# Patient Record
Sex: Female | Born: 1960 | Race: White | Hispanic: No | Marital: Married | Smoking: Never smoker
Health system: Southern US, Community
[De-identification: ages and names within clinical notes are randomized; demographics above are authoritative.]

## PROBLEM LIST (undated history)

## (undated) DIAGNOSIS — F329 Major depressive disorder, single episode, unspecified: Secondary | ICD-10-CM

## (undated) DIAGNOSIS — F32A Depression, unspecified: Secondary | ICD-10-CM

## (undated) DIAGNOSIS — F419 Anxiety disorder, unspecified: Secondary | ICD-10-CM

## (undated) DIAGNOSIS — M1990 Unspecified osteoarthritis, unspecified site: Secondary | ICD-10-CM

## (undated) DIAGNOSIS — I10 Essential (primary) hypertension: Secondary | ICD-10-CM

## (undated) HISTORY — PX: COCCYX REMOVAL: SHX600

## (undated) HISTORY — PX: BACK SURGERY: SHX140

---

## 1998-11-25 ENCOUNTER — Other Ambulatory Visit: Admission: RE | Admit: 1998-11-25 | Discharge: 1998-11-25 | Payer: Self-pay | Admitting: Obstetrics & Gynecology

## 2000-05-25 ENCOUNTER — Other Ambulatory Visit: Admission: RE | Admit: 2000-05-25 | Discharge: 2000-05-25 | Payer: Self-pay | Admitting: Obstetrics & Gynecology

## 2001-06-24 ENCOUNTER — Other Ambulatory Visit: Admission: RE | Admit: 2001-06-24 | Discharge: 2001-06-24 | Payer: Self-pay | Admitting: Obstetrics & Gynecology

## 2001-09-11 ENCOUNTER — Emergency Department (HOSPITAL_COMMUNITY): Admission: EM | Admit: 2001-09-11 | Discharge: 2001-09-11 | Payer: Self-pay

## 2004-03-17 ENCOUNTER — Encounter: Admission: RE | Admit: 2004-03-17 | Discharge: 2004-03-17 | Payer: Self-pay | Admitting: Obstetrics & Gynecology

## 2004-05-12 ENCOUNTER — Other Ambulatory Visit: Admission: RE | Admit: 2004-05-12 | Discharge: 2004-05-12 | Payer: Self-pay | Admitting: Obstetrics & Gynecology

## 2005-09-11 ENCOUNTER — Other Ambulatory Visit: Admission: RE | Admit: 2005-09-11 | Discharge: 2005-09-11 | Payer: Self-pay | Admitting: Obstetrics & Gynecology

## 2007-05-21 ENCOUNTER — Encounter: Admission: RE | Admit: 2007-05-21 | Discharge: 2007-05-21 | Payer: Self-pay | Admitting: Family Medicine

## 2009-09-29 ENCOUNTER — Emergency Department (HOSPITAL_COMMUNITY): Admission: EM | Admit: 2009-09-29 | Discharge: 2009-09-29 | Payer: Self-pay | Admitting: Emergency Medicine

## 2010-11-09 LAB — POCT I-STAT, CHEM 8
BUN: 11 mg/dL (ref 6–23)
Calcium, Ion: 1.16 mmol/L (ref 1.12–1.32)
Chloride: 103 mEq/L (ref 96–112)
Creatinine, Ser: 0.9 mg/dL (ref 0.4–1.2)
Glucose, Bld: 101 mg/dL — ABNORMAL HIGH (ref 70–99)
HCT: 49 % — ABNORMAL HIGH (ref 36.0–46.0)
Hemoglobin: 16.7 g/dL — ABNORMAL HIGH (ref 12.0–15.0)
Potassium: 4.1 mEq/L (ref 3.5–5.1)
Sodium: 136 mEq/L (ref 135–145)
TCO2: 25 mmol/L (ref 0–100)

## 2014-10-08 ENCOUNTER — Other Ambulatory Visit: Payer: Self-pay | Admitting: Obstetrics & Gynecology

## 2014-10-08 DIAGNOSIS — R928 Other abnormal and inconclusive findings on diagnostic imaging of breast: Secondary | ICD-10-CM

## 2014-10-15 ENCOUNTER — Ambulatory Visit
Admission: RE | Admit: 2014-10-15 | Discharge: 2014-10-15 | Disposition: A | Discharge status: Home / Self Care | Payer: PRIVATE HEALTH INSURANCE | Source: Ambulatory Visit | Attending: Obstetrics & Gynecology | Admitting: Obstetrics & Gynecology

## 2014-10-15 DIAGNOSIS — R928 Other abnormal and inconclusive findings on diagnostic imaging of breast: Secondary | ICD-10-CM

## 2015-01-20 ENCOUNTER — Emergency Department (INDEPENDENT_AMBULATORY_CARE_PROVIDER_SITE_OTHER)
Admission: EM | Admit: 2015-01-20 | Discharge: 2015-01-20 | Disposition: A | Discharge status: Home / Self Care | Payer: PRIVATE HEALTH INSURANCE | Source: Home / Self Care | Attending: Emergency Medicine | Admitting: Emergency Medicine

## 2015-01-20 ENCOUNTER — Encounter: Payer: Self-pay | Admitting: *Deleted

## 2015-01-20 DIAGNOSIS — L739 Follicular disorder, unspecified: Secondary | ICD-10-CM | POA: Diagnosis not present

## 2015-01-20 HISTORY — DX: Essential (primary) hypertension: I10

## 2015-01-20 HISTORY — DX: Anxiety disorder, unspecified: F41.9

## 2015-01-20 HISTORY — DX: Major depressive disorder, single episode, unspecified: F32.9

## 2015-01-20 HISTORY — DX: Depression, unspecified: F32.A

## 2015-01-20 MED ORDER — CLINDAMYCIN PHOSPHATE 1 % EX SOLN
Freq: Two times a day (BID) | CUTANEOUS | Status: DC
Start: 2015-01-20 — End: 2015-09-01

## 2015-01-20 MED ORDER — DOXYCYCLINE HYCLATE 100 MG PO CAPS: 100.0000 mg | ORAL_CAPSULE | Freq: Two times a day (BID) | ORAL | Status: DC

## 2015-01-20 NOTE — ED Notes (Signed)
Pt c/o axillary rash since February of this year. No new soaps or Deoderant used. Her PCP advisied her to stop shaving which did not clear up the rash, she was later treated for a sinus infection and told it would cover treatment for the rash, prednisone and Clindamycin. Rash improved but never resolved. She c/o pain extending from her left axilla down her left side. Also has aches on neck, chest and left groin.

## 2015-01-20 NOTE — ED Provider Notes (Signed)
CSN: [id number]     Arrival date & time 01/20/15  1011 History   First MD Initiated Contact with Patient 01/20/15 1042     Chief Complaint  Patient presents with  . Rash   (Consider location/radiation/quality/duration/timing/severity/associated sxs/prior Treatment) HPI Inflamed rash both axillary areas where she shaves.. Intermittently for 2-3 months but recently flared up. Saw PCP couple months ago who prescribed oral clindamycin and prednisone and that helped somewhat but not completely. Had normal mammogram about a month ago she states. Has had normal breast exams at PCP. No fever or chills or nausea or vomiting. Denies using any new soap or detergent or deodorant or any new allergens  Past Medical History  Diagnosis Date  . Hypertension   . Anxiety and depression    Past Surgical History  Procedure Laterality Date  . Cesarean section    . Back surgery    . Coccyx removal     Family History  Problem Relation Age of Onset  . Heart attack Mother   . Heart attack Father    History  Substance Use Topics  . Smoking status: Never Smoker   . Smokeless tobacco: Never Used  . Alcohol Use: Yes   OB History    No data available     Review of Systems Remainder of Review of Systems negative for acute change except as noted in the HPI.  Allergies  Demerol and Percocet  Home Medications   Prior to Admission medications   Medication Sig Start Date End Date Taking? Authorizing Provider  buPROPion (WELLBUTRIN XL) 300 MG 24 hr tablet Take 300 mg by mouth daily.   Yes Historical Provider, MD  estradiol (ESTRACE) 2 MG tablet Take 2 mg by mouth daily.   Yes Historical Provider, MD  metoprolol succinate (TOPROL-XL) 50 MG 24 hr tablet Take 50 mg by mouth daily. Take with or immediately following a meal.   Yes Historical Provider, MD  Multiple Minerals-Vitamins (CALCIUM & VIT D3 BONE HEALTH PO) Take by mouth.   Yes Historical Provider, MD  omeprazole (PRILOSEC) 20 MG capsule Take 20 mg  by mouth daily.   Yes Historical Provider, MD  clindamycin (CLEOCIN-T) 1 % external solution Apply topically 2 (two) times daily. 01/20/15   Lajean Manesavid Massey, MD  doxycycline (VIBRAMYCIN) 100 MG capsule Take 1 capsule (100 mg total) by mouth 2 (two) times daily. For 10-14 days 01/20/15   Lajean Manesavid Massey, MD   BP 159/97 mmHg  Pulse 81  Temp(Src) 98.3 F (36.8 C) (Oral)  Resp 16  Ht 5\' 5"  (1.651 m)  Wt 212 lb (96.163 kg)  BMI 35.28 kg/m2  SpO2 97% Physical Exam  Constitutional: She is oriented to person, place, and time. She appears well-developed and well-nourished. No distress.  HENT:  Head: Normocephalic and atraumatic.  Eyes: Conjunctivae and EOM are normal. Pupils are equal, round, and reactive to light. No scleral icterus.  Neck: Normal range of motion.  Cardiovascular: Normal rate.   Pulmonary/Chest: Effort normal.  Abdominal: She exhibits no distension.  Musculoskeletal: Normal range of motion.  Neurological: She is alert and oriented to person, place, and time.  Skin: Skin is warm. Rash noted.  Folliculitis type rash both axilla  Psychiatric: She has a normal mood and affect.  Nursing note and vitals reviewed.   ED Course  Procedures (including critical care time) Labs Review Labs Reviewed - No data to display  Imaging Review No results found.   MDM   1. Folliculitis    Treatment options  discussed, as well as risks, benefits, alternatives. Patient voiced understanding and agreement with the following plans: Discharge Medication List as of 01/20/2015 11:14 AM    START taking these medications   Details  clindamycin (CLEOCIN-T) 1 % external solution Apply topically 2 (two) times daily., Starting 01/20/2015, Until Discontinued, Normal    doxycycline (VIBRAMYCIN) 100 MG capsule Take 1 capsule (100 mg total) by mouth 2 (two) times daily. For 10-14 days, Starting 01/20/2015, Until Discontinued, Normal       Other advice given. Follow-up with dermatologist in 5-7 days if not  improving, or sooner if symptoms become worse. Precautions discussed. Red flags discussed. Questions invited and answered. Patient voiced understanding and agreement.     Lajean Manes, MD 01/20/15 [id number]

## 2015-09-01 ENCOUNTER — Emergency Department (HOSPITAL_COMMUNITY): Payer: PRIVATE HEALTH INSURANCE

## 2015-09-01 ENCOUNTER — Encounter (HOSPITAL_COMMUNITY): Payer: Self-pay | Admitting: Emergency Medicine

## 2015-09-01 ENCOUNTER — Emergency Department (HOSPITAL_COMMUNITY)
Admission: EM | Admit: 2015-09-01 | Discharge: 2015-09-01 | Disposition: A | Discharge status: Home / Self Care | Payer: PRIVATE HEALTH INSURANCE | Attending: Emergency Medicine | Admitting: Emergency Medicine

## 2015-09-01 DIAGNOSIS — R002 Palpitations: Secondary | ICD-10-CM | POA: Insufficient documentation

## 2015-09-01 DIAGNOSIS — F418 Other specified anxiety disorders: Secondary | ICD-10-CM | POA: Insufficient documentation

## 2015-09-01 DIAGNOSIS — Z79899 Other long term (current) drug therapy: Secondary | ICD-10-CM | POA: Insufficient documentation

## 2015-09-01 DIAGNOSIS — Z792 Long term (current) use of antibiotics: Secondary | ICD-10-CM | POA: Insufficient documentation

## 2015-09-01 DIAGNOSIS — I10 Essential (primary) hypertension: Secondary | ICD-10-CM | POA: Insufficient documentation

## 2015-09-01 DIAGNOSIS — R1013 Epigastric pain: Secondary | ICD-10-CM | POA: Diagnosis not present

## 2015-09-01 DIAGNOSIS — E063 Autoimmune thyroiditis: Secondary | ICD-10-CM | POA: Diagnosis not present

## 2015-09-01 LAB — BASIC METABOLIC PANEL
Anion gap: 10 (ref 5–15)
BUN: 11 mg/dL (ref 6–20)
CHLORIDE: 102 mmol/L (ref 101–111)
CO2: 24 mmol/L (ref 22–32)
Calcium: 9.3 mg/dL (ref 8.9–10.3)
Creatinine, Ser: 0.78 mg/dL (ref 0.44–1.00)
GFR calc Af Amer: >60 mL/min (ref >60)
GFR calc non Af Amer: >60 mL/min (ref >60)
GLUCOSE: 109 mg/dL — AB (ref 65–99)
POTASSIUM: 4.5 mmol/L (ref 3.5–5.1)
SODIUM: 136 mmol/L (ref 135–145)

## 2015-09-01 LAB — CBC
HCT: 40.6 % (ref 36.0–46.0)
HEMOGLOBIN: 13.4 g/dL (ref 12.0–15.0)
MCH: 32 pg (ref 26.0–34.0)
MCHC: 33 g/dL (ref 30.0–36.0)
MCV: 96.9 fL (ref 78.0–100.0)
Platelets: 286 10*3/uL (ref 150–400)
RBC: 4.19 MIL/uL (ref 3.87–5.11)
RDW: 13.2 % (ref 11.5–15.5)
WBC: 4 10*3/uL (ref 4.0–10.5)

## 2015-09-01 LAB — TROPONIN I

## 2015-09-01 NOTE — ED Notes (Signed)
Per [facility] patient complains of frequent heart palpitations over the last several weeks and significant heartburn that started today around 1030 am.   Patient also states she started a new thyroid medication today.

## 2015-09-01 NOTE — Discharge Instructions (Signed)
If you were given medicines take as directed.  If you are on coumadin or contraceptives realize their levels and effectiveness is altered by many different medicines.  If you have any reaction (rash, tongues swelling, other) to the medicines stop taking and see a physician.    If your blood pressure was elevated in the ER make sure you follow up for management with a primary doctor or return for chest pain, shortness of breath or stroke symptoms.  Please follow up as directed and return to the ER or see a physician for new or worsening symptoms.  Thank you. Filed Vitals:   09/01/15 1204  BP: 142/79  Pulse: 71  Temp: 98 F (36.7 C)  TempSrc: Oral  Resp: 20  Height: 5\' 5"  (1.651 m)  Weight: 200 lb (90.719 kg)  SpO2: 99%

## 2015-09-01 NOTE — ED Provider Notes (Signed)
CSN: [id number]     Arrival date & time 09/01/15  1154 History   First MD Initiated Contact with Patient 09/01/15 1205     Chief Complaint  Patient presents with  . Palpitations     (Consider location/radiation/quality/duration/timing/severity/associated sxs/prior Treatment) HPI Comments: 55 year old female with history of Hashimoto's thyroiditis, anxiety presents with palpitations. Patient normally gets intermittent palpitations however they're more persistent and stronger today. Patient has had intermittent burning reflux sensation similar to multiple previous episodes. Currently no chest pain, palpitations resolved. Patient started on a new thyroid medicine today before the palpitations started. No syncope no recent surgeries no blood clot history no cardiac history. Patient has close outpatient follow-up.  Patient is a 55 y.o. female presenting with palpitations. The history is provided by the patient.  Palpitations Associated symptoms: no back pain, no chest pain, no shortness of breath and no vomiting     Past Medical History  Diagnosis Date  . Hypertension   . Anxiety and depression    Past Surgical History  Procedure Laterality Date  . Cesarean section    . Back surgery    . Coccyx removal     Family History  Problem Relation Age of Onset  . Heart attack Mother   . Heart attack Father    Social History  Substance Use Topics  . Smoking status: Never Smoker   . Smokeless tobacco: Never Used  . Alcohol Use: Yes   OB History    No data available     Review of Systems  Constitutional: Negative for fever and chills.  HENT: Negative for congestion.   Eyes: Negative for visual disturbance.  Respiratory: Negative for shortness of breath.   Cardiovascular: Positive for palpitations. Negative for chest pain.  Gastrointestinal: Negative for vomiting and abdominal pain.  Genitourinary: Negative for dysuria and flank pain.  Musculoskeletal: Negative for back pain, neck  pain and neck stiffness.  Skin: Negative for rash.  Neurological: Negative for light-headedness and headaches.      Allergies  Demerol and Percocet  Home Medications   Prior to Admission medications   Medication Sig Start Date End Date Taking? Authorizing Provider  ALPRAZolam Prudy Feeler) 0.5 MG tablet Take 0.25 mg by mouth 2 (two) times daily as needed for anxiety.  06/10/15  Yes Historical Provider, MD  cycloSPORINE (RESTASIS) 0.05 % ophthalmic emulsion Place 1 drop into both eyes 2 (two) times daily.  07/14/14  Yes Historical Provider, MD  EPINEPHrine (EPIPEN 2-PAK) 0.3 mg/0.3 mL IJ SOAJ injection Inject 0.3 mg into the muscle once.  01/29/14  Yes Historical Provider, MD  Evening Primrose Oil 500 MG CAPS Take by mouth.   Yes Historical Provider, MD  metoprolol succinate (TOPROL-XL) 50 MG 24 hr tablet Take 50 mg by mouth daily. Take with or immediately following a meal.   Yes Historical Provider, MD  Multiple Vitamins-Minerals (PRESERVISION AREDS 2) CAPS Take by mouth.   Yes Historical Provider, MD  NATURE-THROID 65 MG tablet Take 65 mg by mouth daily.  08/31/15  Yes Historical Provider, MD  progesterone (PROMETRIUM) 100 MG capsule Take 1 capsule by mouth daily.  10/07/14  Yes Historical Provider, MD  vitamin E 1000 UNIT capsule Take 1,000 Units by mouth daily.   Yes Historical Provider, MD  buPROPion (WELLBUTRIN XL) 300 MG 24 hr tablet Take 300 mg by mouth daily.    Historical Provider, MD  calcium citrate (CALCITRATE - DOSED IN MG ELEMENTAL CALCIUM) 950 MG tablet Take 200 mg of elemental calcium by mouth  daily.     Historical Provider, MD  Cholecalciferol (VITAMIN D) 2000 units tablet Take 2,000 Units by mouth daily.     Historical Provider, MD  clindamycin (CLEOCIN-T) 1 % external solution Apply topically 2 (two) times daily. 01/20/15   Lajean Manesavid Massey, MD  doxycycline (VIBRAMYCIN) 100 MG capsule Take 1 capsule (100 mg total) by mouth 2 (two) times daily. For 10-14 days 01/20/15   Lajean Manesavid Massey, MD   estradiol (ESTRACE) 2 MG tablet Take 2 mg by mouth daily.    Historical Provider, MD  Multiple Minerals-Vitamins (CALCIUM & VIT D3 BONE HEALTH PO) Take by mouth.    Historical Provider, MD  omeprazole (PRILOSEC) 20 MG capsule Take 20 mg by mouth daily.    Historical Provider, MD   BP 142/79 mmHg  Pulse 71  Temp(Src) 98 F (36.7 C) (Oral)  Resp 20  Ht 5\' 5"  (1.651 m)  Wt 200 lb (90.719 kg)  BMI 33.28 kg/m2  SpO2 99% Physical Exam  Constitutional: She is oriented to person, place, and time. She appears well-developed and well-nourished.  HENT:  Head: Normocephalic and atraumatic.  Eyes: Conjunctivae are normal. Right eye exhibits no discharge. Left eye exhibits no discharge.  Neck: Normal range of motion. Neck supple. No tracheal deviation present.  Cardiovascular: Normal rate and regular rhythm.   Pulmonary/Chest: Effort normal and breath sounds normal.  Abdominal: Soft. She exhibits no distension. There is no tenderness. There is no guarding.  Musculoskeletal: She exhibits no edema.  Neurological: She is alert and oriented to person, place, and time.  Skin: Skin is warm. No rash noted.  Psychiatric: She has a normal mood and affect.  Nursing note and vitals reviewed.   ED Course  Procedures (including critical care time) Labs Review Labs Reviewed  BASIC METABOLIC PANEL - Abnormal; Notable for the following:    Glucose, Bld 109 (*)    All other components within normal limits  CBC  TROPONIN I    Imaging Review Dg Chest 2 View  09/01/2015  CLINICAL DATA:  Palpitations and heartburn. EXAM: CHEST  2 VIEW COMPARISON:  None. FINDINGS: The heart size and mediastinal contours are within normal limits. Both lungs are clear. The visualized skeletal structures are unremarkable. IMPRESSION: No active cardiopulmonary disease. Electronically Signed   By: Elige KoHetal  Patel   On: 09/01/2015 12:50   I have personally reviewed and evaluated these images and lab results as part of my medical  decision-making.   EKG Interpretation   Date/Time:  Wednesday September 01 2015 11:58:56 EST Ventricular Rate:  75 PR Interval:  146 QRS Duration: 90 QT Interval:  365 QTC Calculation: 408 R Axis:   149 Text Interpretation:  Right and left arm electrode reversal,  interpretation assumes no reversal Sinus rhythm Right axis deviation  Abnormal T, consider ischemia, lateral leads Confirmed by Samary Shatz  MD,  Yvaine Jankowiak ([id number]) on 09/01/2015 12:57:34 PM      MDM   Final diagnoses:  Palpitations  Epigastric burning sensation   Well-appearing patient presents with worsening palpitations. No chest pain or shortness of breath. Patient has outpatient follow-up. Discussed having a new thyroid medicine and following up with prescriber. Patient stable for outpatient follow.  Results and differential diagnosis were discussed with the patient/parent/guardian. Xrays were independently reviewed by myself.  Close follow up outpatient was discussed, comfortable with the plan.   Medications - No data to display  Filed Vitals:   09/01/15 1204  BP: 142/79  Pulse: 71  Temp: 98 F (36.7 C)  TempSrc: Oral  Resp: 20  Height: 5\' 5"  (1.651 m)  Weight: 200 lb (90.719 kg)  SpO2: 99%    Final diagnoses:  Palpitations  Epigastric burning sensation       Blane Ohara, MD 09/01/15 1331

## 2015-09-01 NOTE — ED Notes (Signed)
Patient states she was started on "Natural Thyroid" today.

## 2016-05-17 IMAGING — CR DG CHEST 2V
2 series · 2 of 2 positions shown · non-contrast
Comparison: None.

CLINICAL DATA: Palpitations and heartburn.

EXAM:
CHEST  2 VIEW

[chest pa]
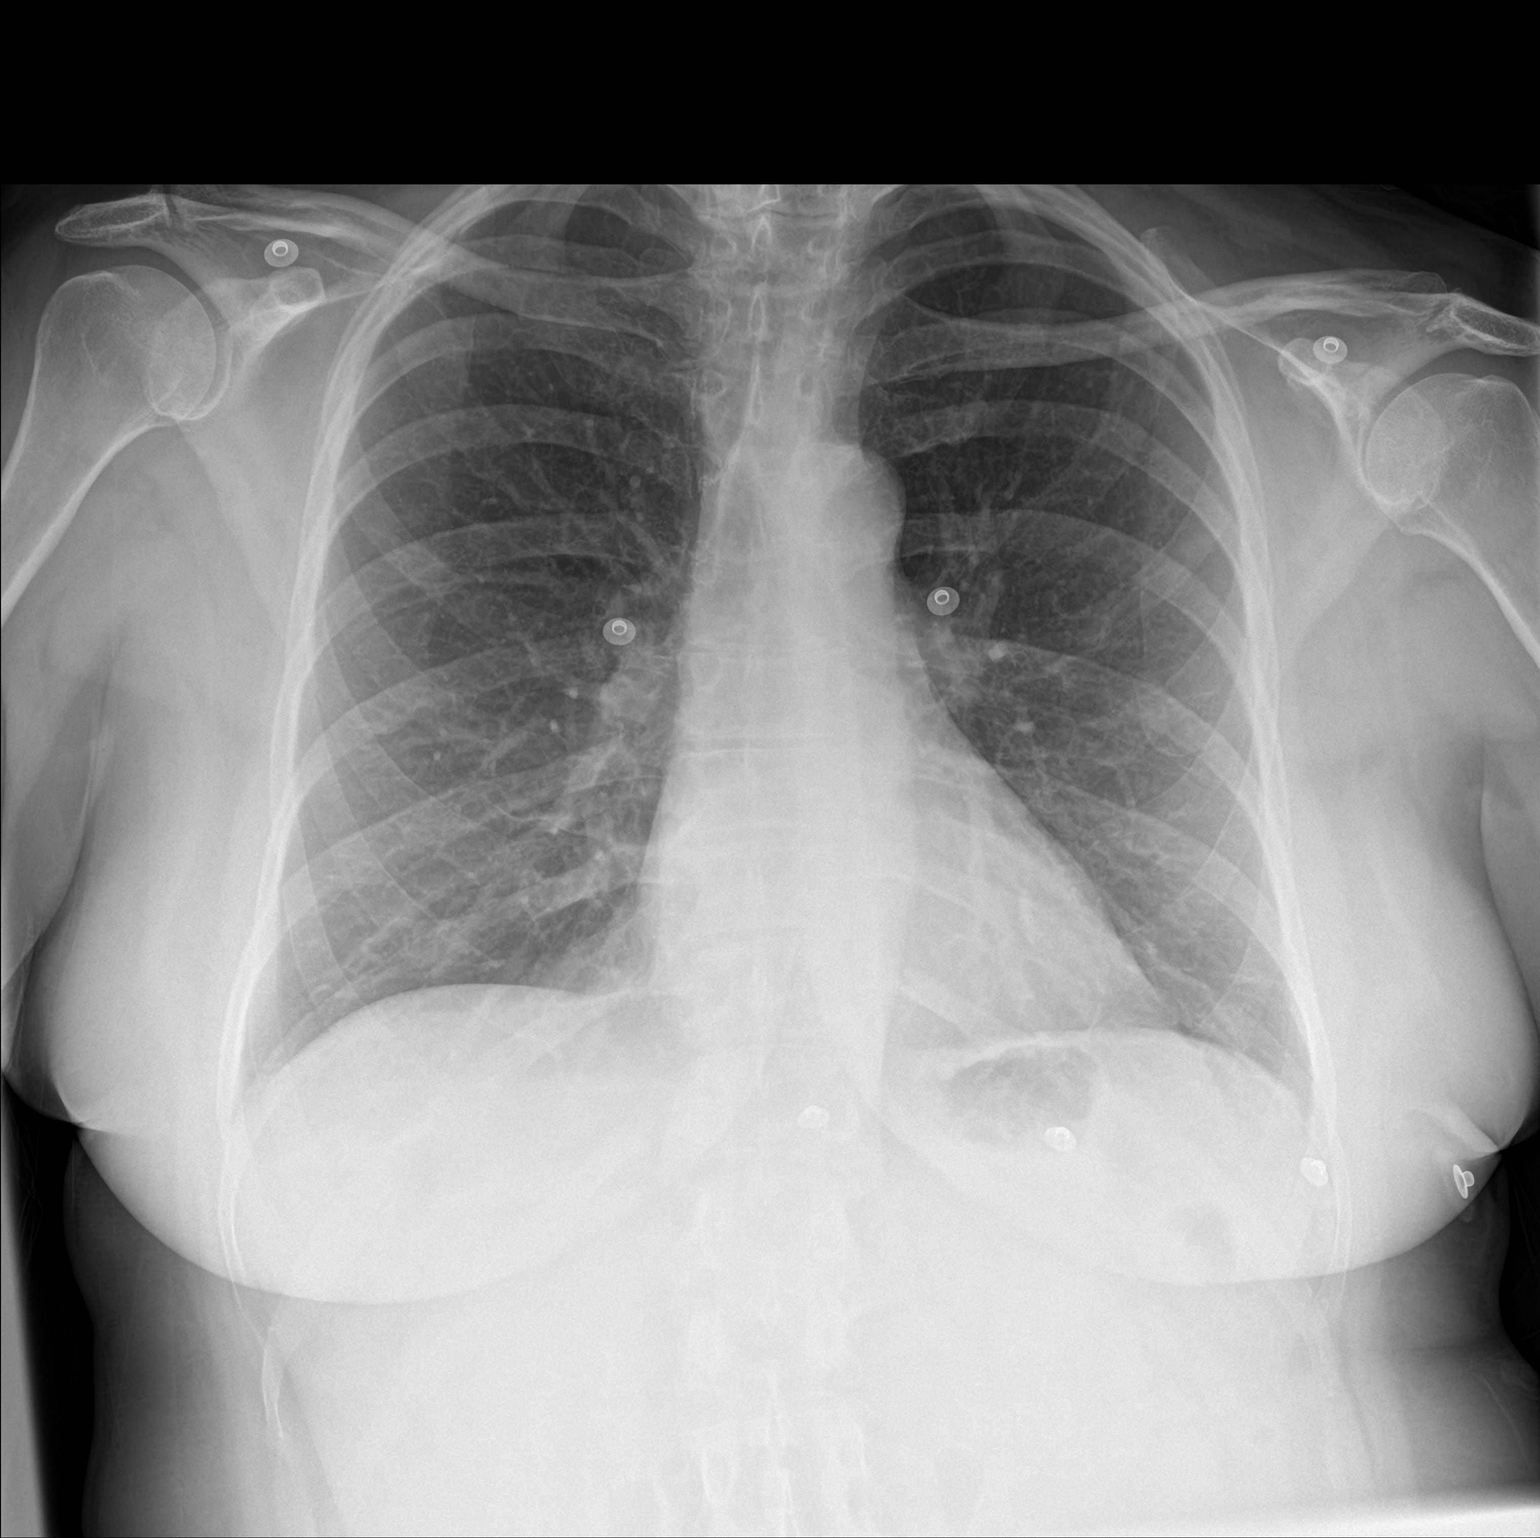

[chest lat]
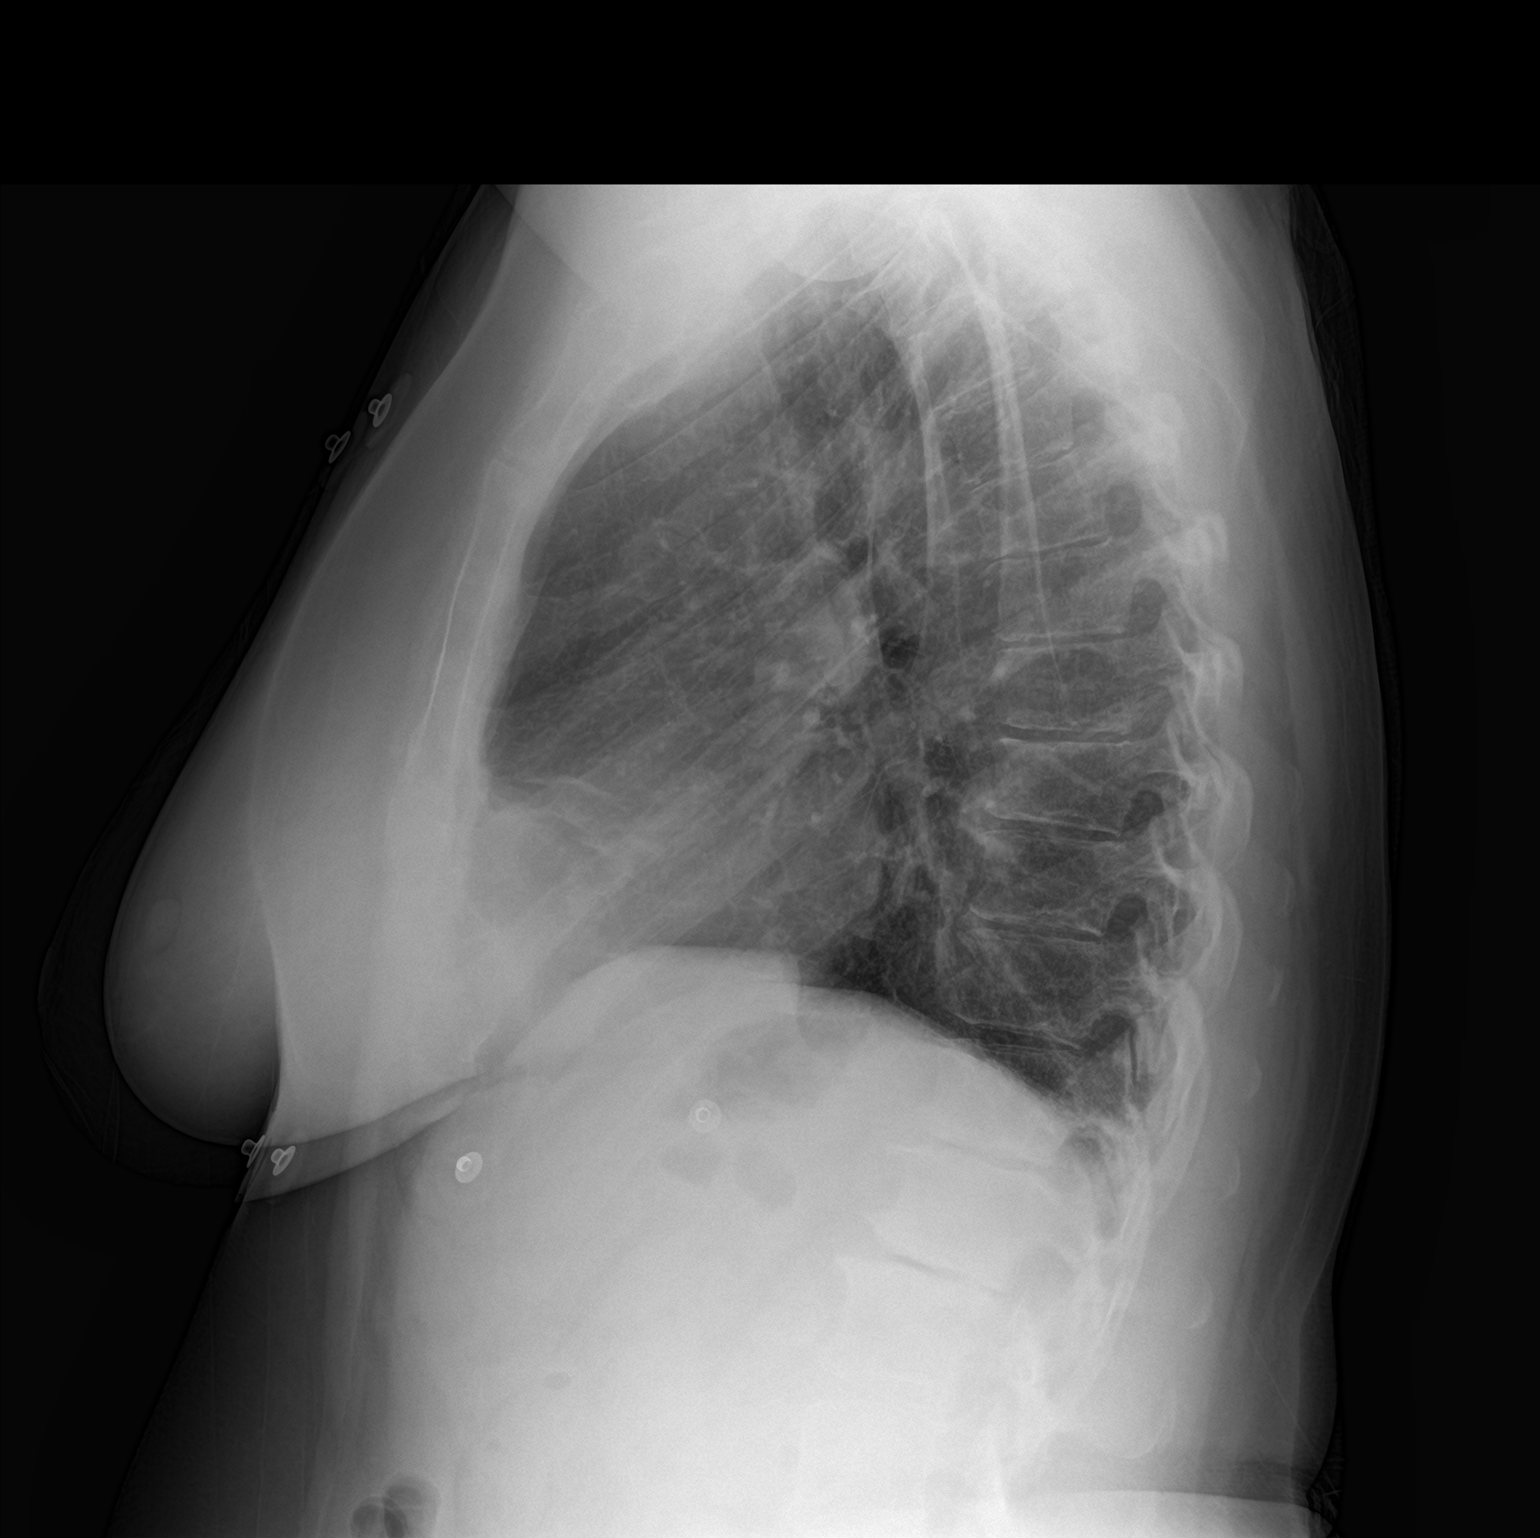

[2 of 2 positions shown; findings below may reference images not displayed]

FINDINGS: The heart size and mediastinal contours are within normal limits.
Both lungs are clear. The visualized skeletal structures are
unremarkable.
IMPRESSION: No active cardiopulmonary disease.

## 2017-06-20 ENCOUNTER — Encounter: Payer: Self-pay | Admitting: Emergency Medicine

## 2017-06-20 ENCOUNTER — Emergency Department (INDEPENDENT_AMBULATORY_CARE_PROVIDER_SITE_OTHER): Payer: PRIVATE HEALTH INSURANCE

## 2017-06-20 ENCOUNTER — Emergency Department (INDEPENDENT_AMBULATORY_CARE_PROVIDER_SITE_OTHER)
Admission: EM | Admit: 2017-06-20 | Discharge: 2017-06-20 | Disposition: A | Discharge status: Home / Self Care | Payer: PRIVATE HEALTH INSURANCE | Source: Home / Self Care | Attending: Emergency Medicine | Admitting: Emergency Medicine

## 2017-06-20 DIAGNOSIS — M79672 Pain in left foot: Secondary | ICD-10-CM

## 2017-06-20 DIAGNOSIS — M2011 Hallux valgus (acquired), right foot: Secondary | ICD-10-CM | POA: Diagnosis not present

## 2017-06-20 DIAGNOSIS — M7731 Calcaneal spur, right foot: Secondary | ICD-10-CM

## 2017-06-20 HISTORY — DX: Unspecified osteoarthritis, unspecified site: M19.90

## 2017-06-20 HISTORY — DX: Depression, unspecified: F32.A

## 2017-06-20 HISTORY — DX: Major depressive disorder, single episode, unspecified: F32.9

## 2017-06-20 MED ORDER — MELOXICAM 7.5 MG PO TABS: ORAL_TABLET | ORAL | 0 refills | Status: DC

## 2017-06-20 NOTE — Discharge Instructions (Signed)
Please stop your ibuprofen. I have sent a prescription for meloxicam to the pharmacy. Please make an appointment to see her podiatrist.

## 2017-06-20 NOTE — ED Triage Notes (Signed)
Rt foot pain x 4 months worse x 2 weeks

## 2017-06-20 NOTE — ED Provider Notes (Signed)
[facility]    CSN: [id number] Arrival date & time: 06/20/17  1918     History   Chief Complaint Chief Complaint  Patient presents with  . Foot Pain    HPI Marie KONKEL is a 56 y.o. female.   HPI Patient enters with pain in the right midfoot. She has pain withstanding or ambulation. This has been going on for about 2 months and has become increasingly severe over the last 2 weeks. She has worn orthotics in the past and he wears them off and on at work. Past Medical History:  Diagnosis Date  . Anxiety and depression   . Arthritis   . Depression   . Hypertension     There are no active problems to display for this patient.   Past Surgical History:  Procedure Laterality Date  . BACK SURGERY    . CESAREAN SECTION    . COCCYX REMOVAL      OB History    No data available       Home Medications    Prior to Admission medications   Medication Sig Start Date End Date Taking? Authorizing Provider  ALPRAZolam Prudy Feeler) 0.5 MG tablet Take 0.25 mg by mouth 2 (two) times daily as needed for anxiety.  06/10/15   [provider]  buPROPion (WELLBUTRIN XL) 300 MG 24 hr tablet Take 300 mg by mouth daily.    [provider]  calcium citrate (CALCITRATE - DOSED IN MG ELEMENTAL CALCIUM) 950 MG tablet Take 200 mg of elemental calcium by mouth daily.     [provider]  Cholecalciferol (VITAMIN D) 2000 units tablet Take 2,000 Units by mouth daily.     [provider]  cycloSPORINE (RESTASIS) 0.05 % ophthalmic emulsion Place 1 drop into both eyes 2 (two) times daily.  07/14/14   [provider]  EPINEPHrine (EPIPEN 2-PAK) 0.3 mg/0.3 mL IJ SOAJ injection Inject 0.3 mg into the muscle once.  01/29/14   [provider]  estradiol (ESTRACE) 2 MG tablet Take 2 mg by mouth daily.    [provider]  Evening Primrose Oil 500 MG CAPS Take by mouth.    [provider]  meloxicam (MOBIC) 7.5 MG tablet Take  1-2 tablets daily with food for pain. 06/20/17   Collene Gobble, MD  metoprolol succinate (TOPROL-XL) 50 MG 24 hr tablet Take 50 mg by mouth daily. Take with or immediately following a meal.    [provider]  Multiple Minerals-Vitamins (CALCIUM & VIT D3 BONE HEALTH PO) Take by mouth.    [provider]  Multiple Vitamins-Minerals (PRESERVISION AREDS 2) CAPS Take by mouth.    [provider]  NATURE-THROID 65 MG tablet Take 65 mg by mouth daily.  08/31/15   [provider]  omeprazole (PRILOSEC) 20 MG capsule Take 20 mg by mouth daily.    [provider]  progesterone (PROMETRIUM) 100 MG capsule Take 1 capsule by mouth daily.  10/07/14   [provider]  vitamin E 1000 UNIT capsule Take 1,000 Units by mouth daily.    [provider]    Family History Family History  Problem Relation Age of Onset  . Heart attack Mother   . Heart attack Father     Social History Social History  Substance Use Topics  . Smoking status: Never Smoker  . Smokeless tobacco: Never Used  . Alcohol use Yes     Allergies   Demerol [meperidine] and Percocet [oxycodone-acetaminophen]  Review of Systems Review of Systems  Musculoskeletal: Positive for gait problem and joint swelling.       Pain and discomfort primarily in the right foot. She has no trouble with the left foot.     Physical Exam Triage Vital Signs ED Triage Vitals [06/20/17 1929]  Enc Vitals Group     BP (!) 154/91     Pulse Rate 91     Resp      Temp 98.2 F (36.8 C)     Temp Source Oral     SpO2 98 %     Weight 202 lb (91.6 kg)     Height 5\' 1"  (1.549 m)     Head Circumference      Peak Flow      Pain Score 8     Pain Loc      Pain Edu?      Excl. in GC?    No data found.   Updated Vital Signs BP (!) 154/91 (BP Location: Left Arm)   Pulse 91   Temp 98.2 F (36.8 C) (Oral)   Ht 5\' 1"  (1.549 m)   Wt 202 lb (91.6 kg)   SpO2 98%   BMI 38.17 kg/m    Visual Acuity Right Eye Distance:   Left Eye Distance:   Bilateral Distance:    Right Eye Near:   Left Eye Near:    Bilateral Near:     Physical Exam  Musculoskeletal:  There is tenderness across the midfoot on the right. There is no swelling noted. The arch appears normal. There is no tenderness over the Achilles or over the MTP joints.There is no obvious swelling noted. Sensation appears normal. Dorsalis pedis and posterior tibial pulses are normal.     UC Treatments / Results  Labs (all labs ordered are listed, but only abnormal results are displayed) Labs Reviewed - No data to display  EKG  EKG Interpretation None       Radiology Dg Foot Complete Right  Result Date: 06/20/2017 CLINICAL DATA:  Right mid dorsal foot pain times several months without known injury. EXAM: RIGHT FOOT COMPLETE - 3+ VIEW COMPARISON:  None. FINDINGS: There is no evidence of fracture or dislocation. 32 degrees of hallux valgus. No suspicious osseous lesions. Tiny plantar calcaneal enthesophyte. No joint dislocation. Soft tissues are unremarkable. IMPRESSION: 1. Hallux valgus. 2. No acute osseous abnormality. 3. Calcaneal enthesopathy. Electronically Signed   By: Tollie Ethavid  Kwon M.D.   On: 06/20/2017 19:47    Procedures Procedures (including critical care time)  Medications Ordered in UC Medications - No data to display   Initial Impression / Assessment and Plan / UC Course  I have reviewed the triage vital signs and the nursing notes.  Pertinent labs & imaging results that were available during my care of the patient were reviewed by me and considered in my medical decision making (see chart for details).        Final Clinical Impressions(s) / UC Diagnoses   Final diagnoses:  Foot pain, left    New Prescriptions Discharge Medication List as of 06/20/2017  8:04 PM    START taking these medications   Details  meloxicam (MOBIC) 7.5 MG tablet Take 1-2 tablets daily with food for  pain., Print         Controlled Substance Prescriptions [place] Controlled Substance Registry consulted? Not Applicable   Collene Gobbleaub, Eligha Kmetz A, MD 06/20/17 2042

## 2018-03-06 IMAGING — DX DG FOOT COMPLETE 3+V*R*
3 series · 3 of 3 positions shown · non-contrast
Comparison: None.

CLINICAL DATA: Right mid dorsal foot pain times several months
without known injury.

EXAM:
RIGHT FOOT COMPLETE - 3+ VIEW

[foot ap]
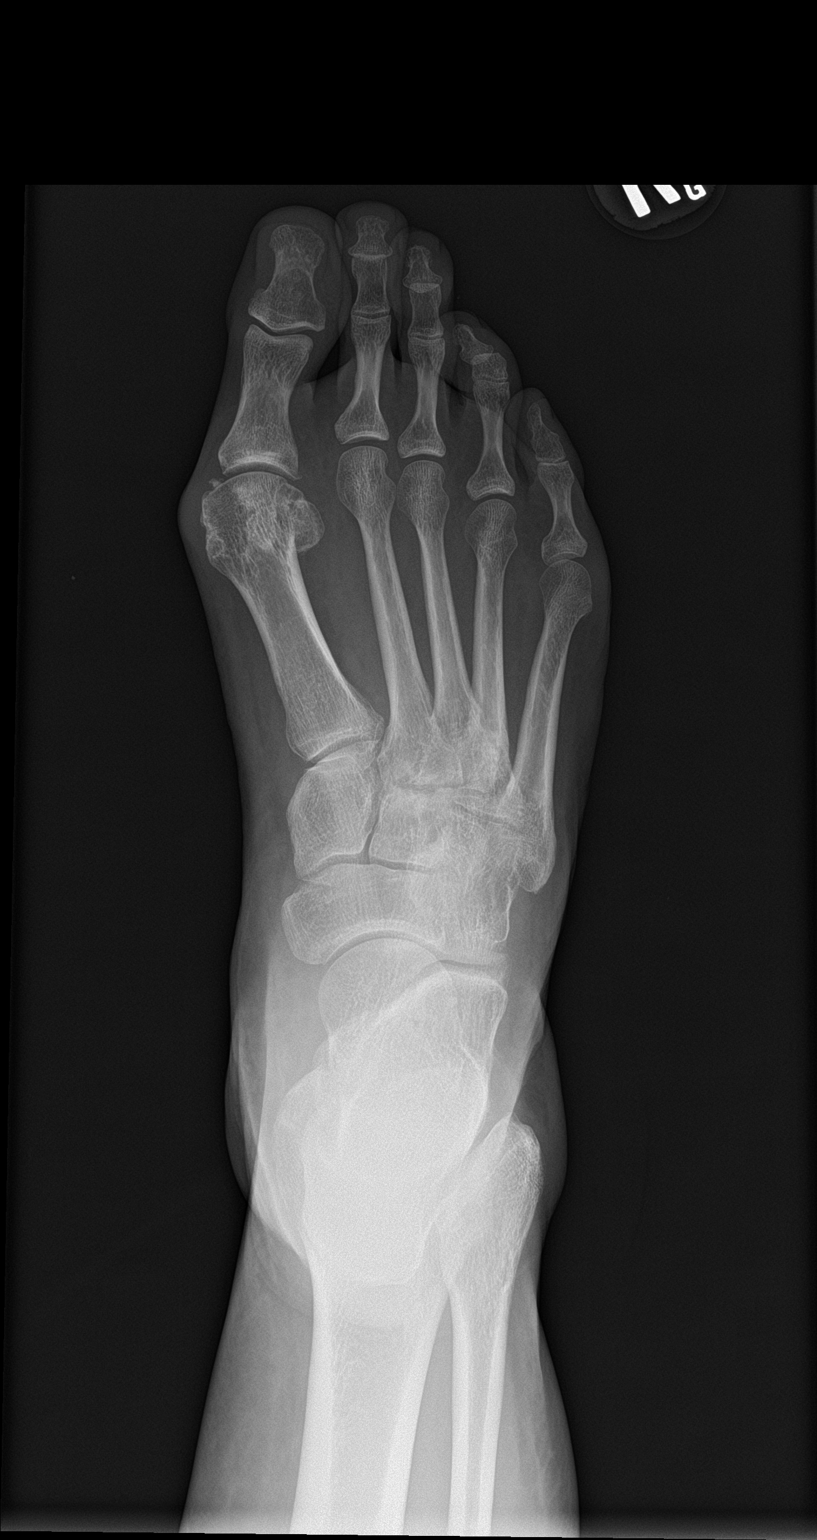

[foot obl]
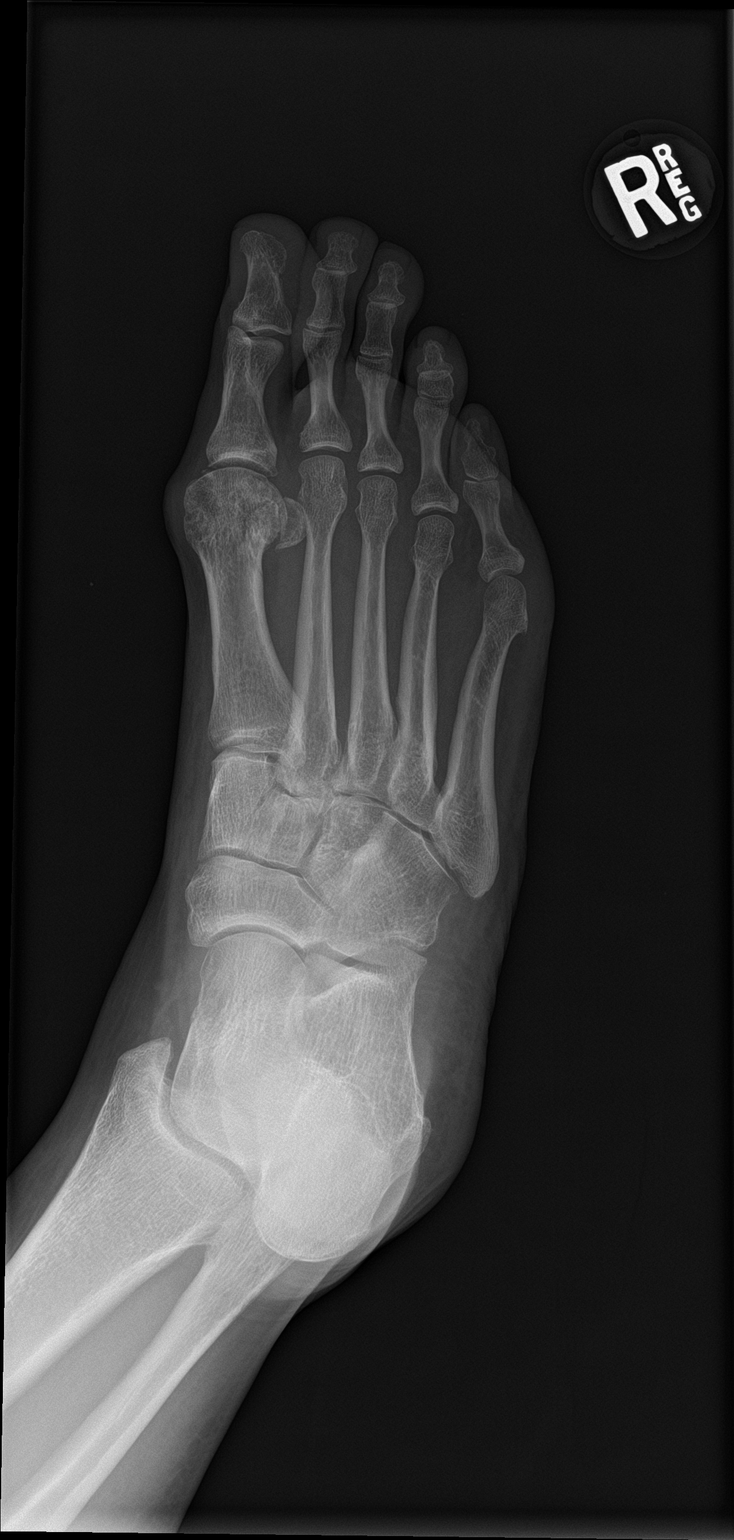

[foot lat]
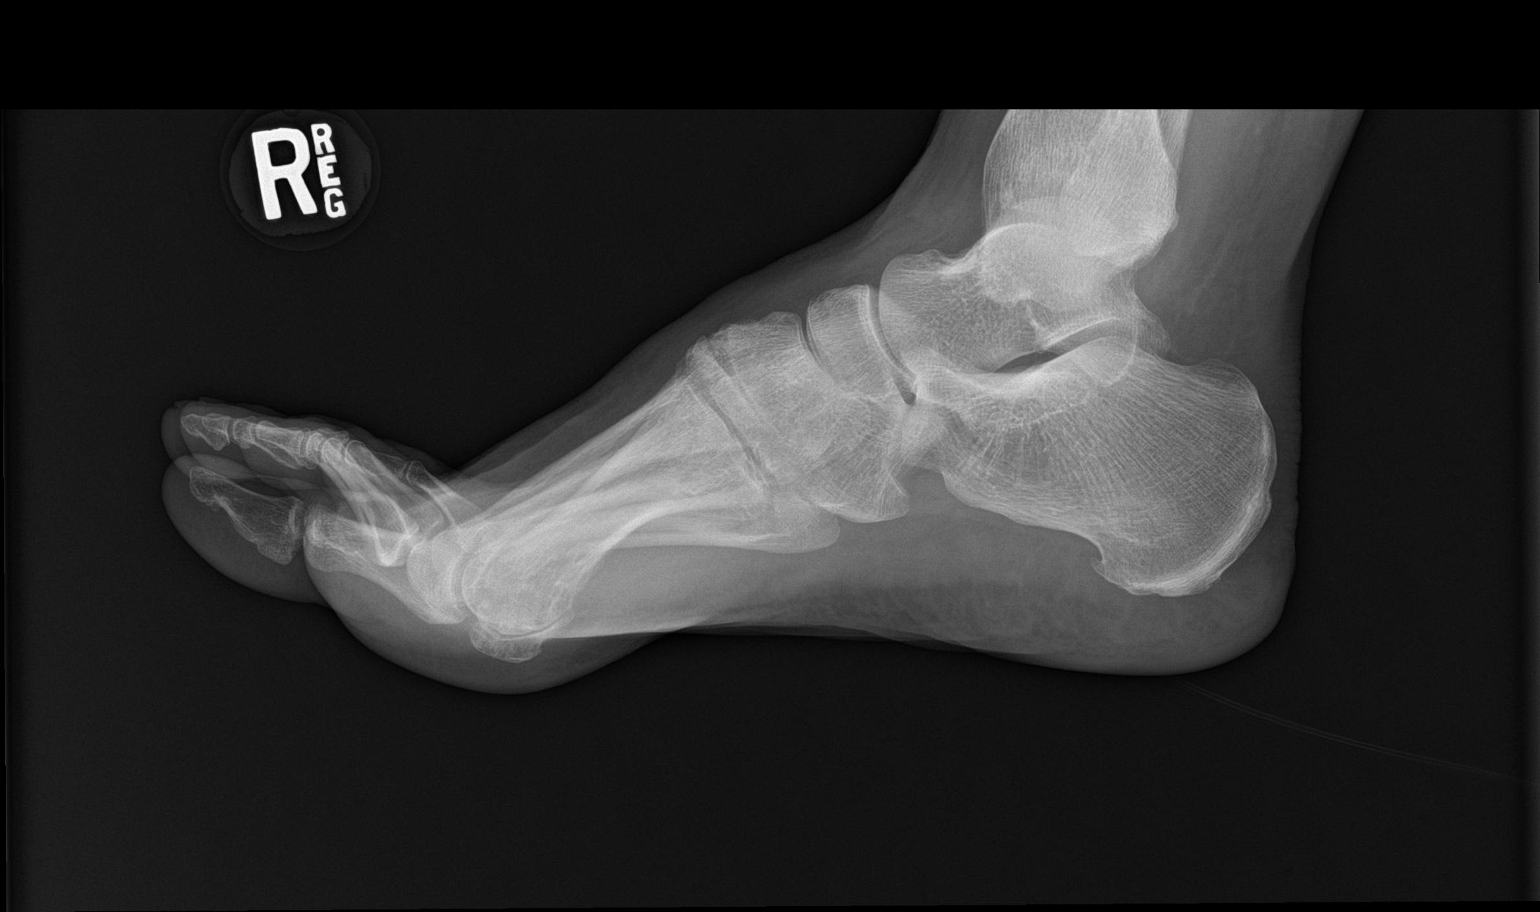

[3 of 3 positions shown; findings below may reference images not displayed]

FINDINGS: There is no evidence of fracture or dislocation. 32 degrees of
hallux valgus. No suspicious osseous lesions. Tiny plantar calcaneal
enthesophyte. No joint dislocation. Soft tissues are unremarkable.
IMPRESSION: 1. Hallux valgus.
2. No acute osseous abnormality.
3. Calcaneal enthesopathy.

## 2023-05-24 ENCOUNTER — Other Ambulatory Visit: Payer: Self-pay

## 2023-05-24 ENCOUNTER — Emergency Department (HOSPITAL_COMMUNITY): Payer: PRIVATE HEALTH INSURANCE

## 2023-05-24 ENCOUNTER — Emergency Department (HOSPITAL_COMMUNITY)
Admission: EM | Admit: 2023-05-24 | Discharge: 2023-05-24 | Disposition: A | Discharge status: Home / Self Care | Payer: PRIVATE HEALTH INSURANCE | Attending: Emergency Medicine | Admitting: Emergency Medicine

## 2023-05-24 DIAGNOSIS — R0789 Other chest pain: Secondary | ICD-10-CM | POA: Insufficient documentation

## 2023-05-24 DIAGNOSIS — R002 Palpitations: Secondary | ICD-10-CM | POA: Insufficient documentation

## 2023-05-24 DIAGNOSIS — R200 Anesthesia of skin: Secondary | ICD-10-CM | POA: Diagnosis not present

## 2023-05-24 DIAGNOSIS — Z79899 Other long term (current) drug therapy: Secondary | ICD-10-CM | POA: Insufficient documentation

## 2023-05-24 DIAGNOSIS — R079 Chest pain, unspecified: Secondary | ICD-10-CM

## 2023-05-24 DIAGNOSIS — I10 Essential (primary) hypertension: Secondary | ICD-10-CM | POA: Diagnosis not present

## 2023-05-24 LAB — BASIC METABOLIC PANEL
Anion gap: 9 (ref 5–15)
BUN: 17 mg/dL (ref 8–23)
CO2: 24 mmol/L (ref 22–32)
Calcium: 8.8 mg/dL — ABNORMAL LOW (ref 8.9–10.3)
Chloride: 101 mmol/L (ref 98–111)
Creatinine, Ser: 0.9 mg/dL (ref 0.44–1.00)
GFR, Estimated: >60 mL/min (ref >60)
Glucose, Bld: 119 mg/dL — ABNORMAL HIGH (ref 70–99)
Potassium: 4.6 mmol/L (ref 3.5–5.1)
Sodium: 134 mmol/L — ABNORMAL LOW (ref 135–145)

## 2023-05-24 LAB — CBC
HCT: 40.5 % (ref 36.0–46.0)
Hemoglobin: 13.2 g/dL (ref 12.0–15.0)
MCH: 32 pg (ref 26.0–34.0)
MCHC: 32.6 g/dL (ref 30.0–36.0)
MCV: 98.1 fL (ref 80.0–100.0)
Platelets: 314 10*3/uL (ref 150–400)
RBC: 4.13 MIL/uL (ref 3.87–5.11)
RDW: 13.3 % (ref 11.5–15.5)
WBC: 4 10*3/uL (ref 4.0–10.5)
nRBC: 0 % (ref 0.0–0.2)

## 2023-05-24 LAB — D-DIMER, QUANTITATIVE: D-Dimer, Quant: 0.3 ug{FEU}/mL (ref 0.00–0.50)

## 2023-05-24 LAB — TROPONIN I (HIGH SENSITIVITY)
Troponin I (High Sensitivity): 2 ng/L (ref <18)
Troponin I (High Sensitivity): 3 ng/L (ref <18)

## 2023-05-24 NOTE — Discharge Instructions (Addendum)
You were seen for your chest pain in the emergency department.   At home, please continue taking your metoprolol which will decrease your palpitations.    Follow-up with your primary doctor in 2-3 days regarding your visit.  Follow-up with cardiology about an appointment to see if you need a Holter monitor for your palpitations.  If they do not call you within 72 business hours you may reach out to them using the information on this packet.  Return immediately to the emergency department if you experience any of the following: Worsening pain, difficulty breathing, unexplained vomiting or sweating, or any other concerning symptoms.    Thank you for visiting our Emergency Department. It was a pleasure taking care of you today.

## 2023-05-24 NOTE — ED Triage Notes (Signed)
Pt. Stated, Ive had chest pain off and on for the last week . Ive also had some either indigestion or nausea. My left arm will have some heaviness off and on. I see a cardilolist on Oct 14.

## 2023-05-24 NOTE — ED Provider Notes (Signed)
[place] EMERGENCY DEPARTMENT AT [facility] Provider Note   CSN: [id number] Arrival date & time: 05/24/23  0848     History  Chief Complaint  Patient presents with   Chest Pain   Nausea    Marie Wall is a 62 y.o. female.  62 year old female with a history of hypertension, PACs, GERD, and anxiety who presents to the emergency department with chest pain.  Patient reports for the past 8 days has been having chest discomfort.  Describes it as a spasm like sensation in the center of her chest that last for several seconds and then leaves.  Worsened with exertion.  This morning at 5:30 AM started having left-sided chest discomfort that felt somewhat like indigestion and also reported numbness sensation or heaviness in her left arm.  Took 324 of aspirin and came to the emergency department.  During all of this is also had shortness of breath and cough.  Not on blood thinners.  No history of DVT/PE.  Is currently on estradiol and progesterone.  No personal history of MI or tobacco use.  Mother had an MI at the age of 33 and father at 98.  Does have an upcoming cardiology appointment.       Home Medications Prior to Admission medications   Medication Sig Start Date End Date Taking? Authorizing Provider  Ascorbic Acid (VITAMIN C) 1000 MG tablet Take 1,000 mg by mouth daily.   Yes [provider]  aspirin 325 MG tablet Take 325 mg by mouth as needed for moderate pain.   Yes [provider]  buPROPion (WELLBUTRIN XL) 150 MG 24 hr tablet Take 150 mg by mouth daily.   Yes [provider]  Cholecalciferol (D3 5000) 125 MCG (5000 UT) capsule Take 15,000 Units by mouth every evening.   Yes [provider]  Coenzyme Q10 (COQ10) 200 MG CAPS Take 200 mg by mouth daily.   Yes [provider]  diphenhydrAMINE (BENADRYL) 25 MG tablet Take 25 mg by mouth daily.   Yes [provider]  estradiol (CLIMARA - DOSED IN MG/24 HR) 0.075  mg/24hr patch Place 0.075 mg onto the skin See admin instructions. Apply 1 patch to the skin twice weekly.   Yes [provider]  EVENING PRIMROSE OIL PO Take 1,300 mg by mouth daily.   Yes [provider]  Lutein 40 MG CAPS Take 40 mg by mouth daily.   Yes [provider]  MAGNESIUM MALATE PO Take 4,050 mg by mouth every evening.   Yes [provider]  metoprolol succinate (TOPROL-XL) 50 MG 24 hr tablet Take 50 mg by mouth daily. Take with or immediately following a meal.   Yes [provider]  omeprazole (PRILOSEC) 20 MG capsule Take 20 mg by mouth daily.   Yes [provider]  OVER THE COUNTER MEDICATION Take 250 mg by mouth daily. Noot[employer] [place] Supercritical CO2 Coriander Extract Capsules   Yes [provider]  OVER THE COUNTER MEDICATION Take 1 capsule by mouth See admin instructions. Methylation Complete -- Take 1 capsule by mouth three times a week, Monday, Wednesday, and Friday.   Yes [provider]  OVER THE COUNTER MEDICATION Take 450 mg by mouth daily. Berberine   Yes [provider]  Prasterone, DHEA, (DHEA PO) Take 1 capsule by mouth daily. DHEA-5   Yes [provider]  Pregnenolone Micronized (PREGNENOLONE PO) Take 100 mg by mouth daily.   Yes [provider]  Probiotic Product (  PROBIOTIC PO) Take 1 capsule by mouth daily. 10 strain 25 billion CFU   Yes [provider]  progesterone (PROMETRIUM) 100 MG capsule Take 300 mg by mouth every evening. 10/07/14  Yes [provider]  Red Yeast Rice 600 MG TABS Take 600 mg by mouth daily.   Yes [provider]  selenium 200 MCG TABS tablet Take 200 mcg by mouth daily.   Yes [provider]  zinc gluconate 50 MG tablet Take 50 mg by mouth daily.   Yes [provider]      Allergies    Wasp venom, Demerol [meperidine], and Percocet [oxycodone-acetaminophen]    Review of Systems   Review of  Systems  Physical Exam Updated Vital Signs BP 115/72   Pulse 68   Temp 98.1 F (36.7 C) (Oral)   Resp 18   Ht 5\' 5"  (1.651 m)   Wt 90.7 kg   SpO2 96%   BMI 33.28 kg/m  Physical Exam Vitals and nursing note reviewed.  Constitutional:      General: She is not in acute distress.    Appearance: She is well-developed.  HENT:     Head: Normocephalic and atraumatic.     Right Ear: External ear normal.     Left Ear: External ear normal.     Nose: Nose normal.  Eyes:     Extraocular Movements: Extraocular movements intact.     Conjunctiva/sclera: Conjunctivae normal.     Pupils: Pupils are equal, round, and reactive to light.  Cardiovascular:     Rate and Rhythm: Normal rate and regular rhythm.     Heart sounds: No murmur heard. Pulmonary:     Effort: Pulmonary effort is normal. No respiratory distress.     Breath sounds: Normal breath sounds.  Musculoskeletal:     Cervical back: Normal range of motion and neck supple.     Right lower leg: No edema.     Left lower leg: No edema.  Skin:    General: Skin is warm and dry.  Neurological:     Mental Status: She is alert and oriented to person, place, and time. Mental status is at baseline.  Psychiatric:        Mood and Affect: Mood normal.     ED Results / Procedures / Treatments   Labs (all labs ordered are listed, but only abnormal results are displayed) Labs Reviewed  BASIC METABOLIC PANEL - Abnormal; Notable for the following components:      Result Value   Sodium 134 (*)    Glucose, Bld 119 (*)    Calcium 8.8 (*)    All other components within normal limits  CBC  D-DIMER, QUANTITATIVE (NOT AT [facility])  TROPONIN I (HIGH SENSITIVITY)  TROPONIN I (HIGH SENSITIVITY)    EKG EKG Interpretation Date/Time:  Thursday May 24 2023 08:55:18 EDT Ventricular Rate:  75 PR Interval:  130 QRS Duration:  84 QT Interval:  370 QTC Calculation: 413 R Axis:   29  Text Interpretation: Normal sinus rhythm Normal ECG Confirmed  by Vonita Moss [phone]) on 05/24/2023 9:36:02 AM  Radiology DG Chest 2 View  Result Date: 05/24/2023 CLINICAL DATA:  Intermittent chest pain and nausea. EXAM: CHEST - 2 VIEW COMPARISON:  September 01, 2015 FINDINGS: The heart size and mediastinal contours are within normal limits. Both lungs are clear. Multilevel degenerative changes are seen throughout the thoracic spine. IMPRESSION: No active cardiopulmonary disease. Electronically Signed   By: Demetrius Revel.D.  On: 05/24/2023 09:53    Procedures Procedures    Medications Ordered in ED Medications - No data to display  ED Course/ Medical Decision Making/ A&P                                 Medical Decision Making Amount and/or Complexity of Data Reviewed Labs: ordered. Radiology: ordered.   MATISHA TERMINE is a 62 y.o. female with comorbidities that complicate the patient evaluation including  hypertension, PACs, GERD, and anxiety who presents to the emergency department with chest pain.    Initial Ddx:  PACs, PVCs, paroxysmal arrhythmia, MI, PE, GERD, esophageal spasm  MDM/Course:  Patient presents to the emergency department with chest discomfort for several seconds that is fleeting.  Also has been having some palpitations.  Does have a history of PACs.  Overall well-appearing on exam.  EKG and serial troponins without evidence of acute MI.  With her shortness of breath and estrogen use did send a D-dimer that was WNL.  Upon re-evaluation patient remained chest discomfort free.  Suspect that she may be having paroxysmal arrhythmia that is causing her symptoms we will have her follow-up with cardiology and discuss Holter monitor with them and if she needs any provocative testing for her chest discomfort.  This patient presents to the ED for concern of complaints listed in HPI, this involves an extensive number of treatment options, and is a complaint that carries with it a high risk of complications and morbidity.  Disposition including potential need for admission considered.   Dispo: DC Home. Return precautions discussed including, but not limited to, those listed in the AVS. Allowed pt time to ask questions which were answered fully prior to dc.  Additional history obtained from spouse Records reviewed Outpatient Clinic Notes The following labs were independently interpreted: Chemistry and show no acute abnormality I independently reviewed the following imaging with scope of interpretation limited to determining acute life threatening conditions related to emergency care: Chest x-ray and agree with the radiologist interpretation with the following exceptions: none I personally reviewed and interpreted cardiac monitoring: normal sinus rhythm  I personally reviewed and interpreted the pt's EKG: see above for interpretation  I have reviewed the patients home medications and made adjustments as needed  Portions of this note were generated with Dragon dictation software. Dictation errors may occur despite best attempts at proofreading.           Final Clinical Impression(s) / ED Diagnoses Final diagnoses:  Palpitations  Chest pain, unspecified type    Rx / DC Orders ED Discharge Orders          Ordered    Ambulatory referral to Cardiology        05/24/23 1504              Rondel Baton, MD 05/24/23 1546
